# Patient Record
Sex: Male | Born: 1955 | Race: White | Hispanic: No | Marital: Single
Health system: Southern US, Community
[De-identification: ages and names within clinical notes are randomized; demographics above are authoritative.]

---

## 2006-11-29 ENCOUNTER — Ambulatory Visit: Payer: Self-pay | Admitting: Unknown Physician Specialty

## 2007-02-25 ENCOUNTER — Other Ambulatory Visit: Payer: Self-pay

## 2007-02-25 ENCOUNTER — Emergency Department: Payer: Self-pay | Admitting: Emergency Medicine

## 2008-02-09 ENCOUNTER — Ambulatory Visit: Payer: Self-pay | Admitting: General Surgery

## 2009-04-14 IMAGING — CR DG CHEST 2V
1 series · 3 of 3 positions shown · non-contrast
Comparison: none

REASON FOR EXAM: Chest pain
COMMENTS:

PROCEDURE:     DXR - DXR CHEST PA (OR AP) AND LATERAL  - February 25, 2007  [DATE]
RESULT:     The lungs are clear. The cardiac silhouette and visualized bony
skeleton are unremarkable.

[Series 1: view not recorded · 0.17mm/px · 3 of 3 slices shown]
[im 1/3]
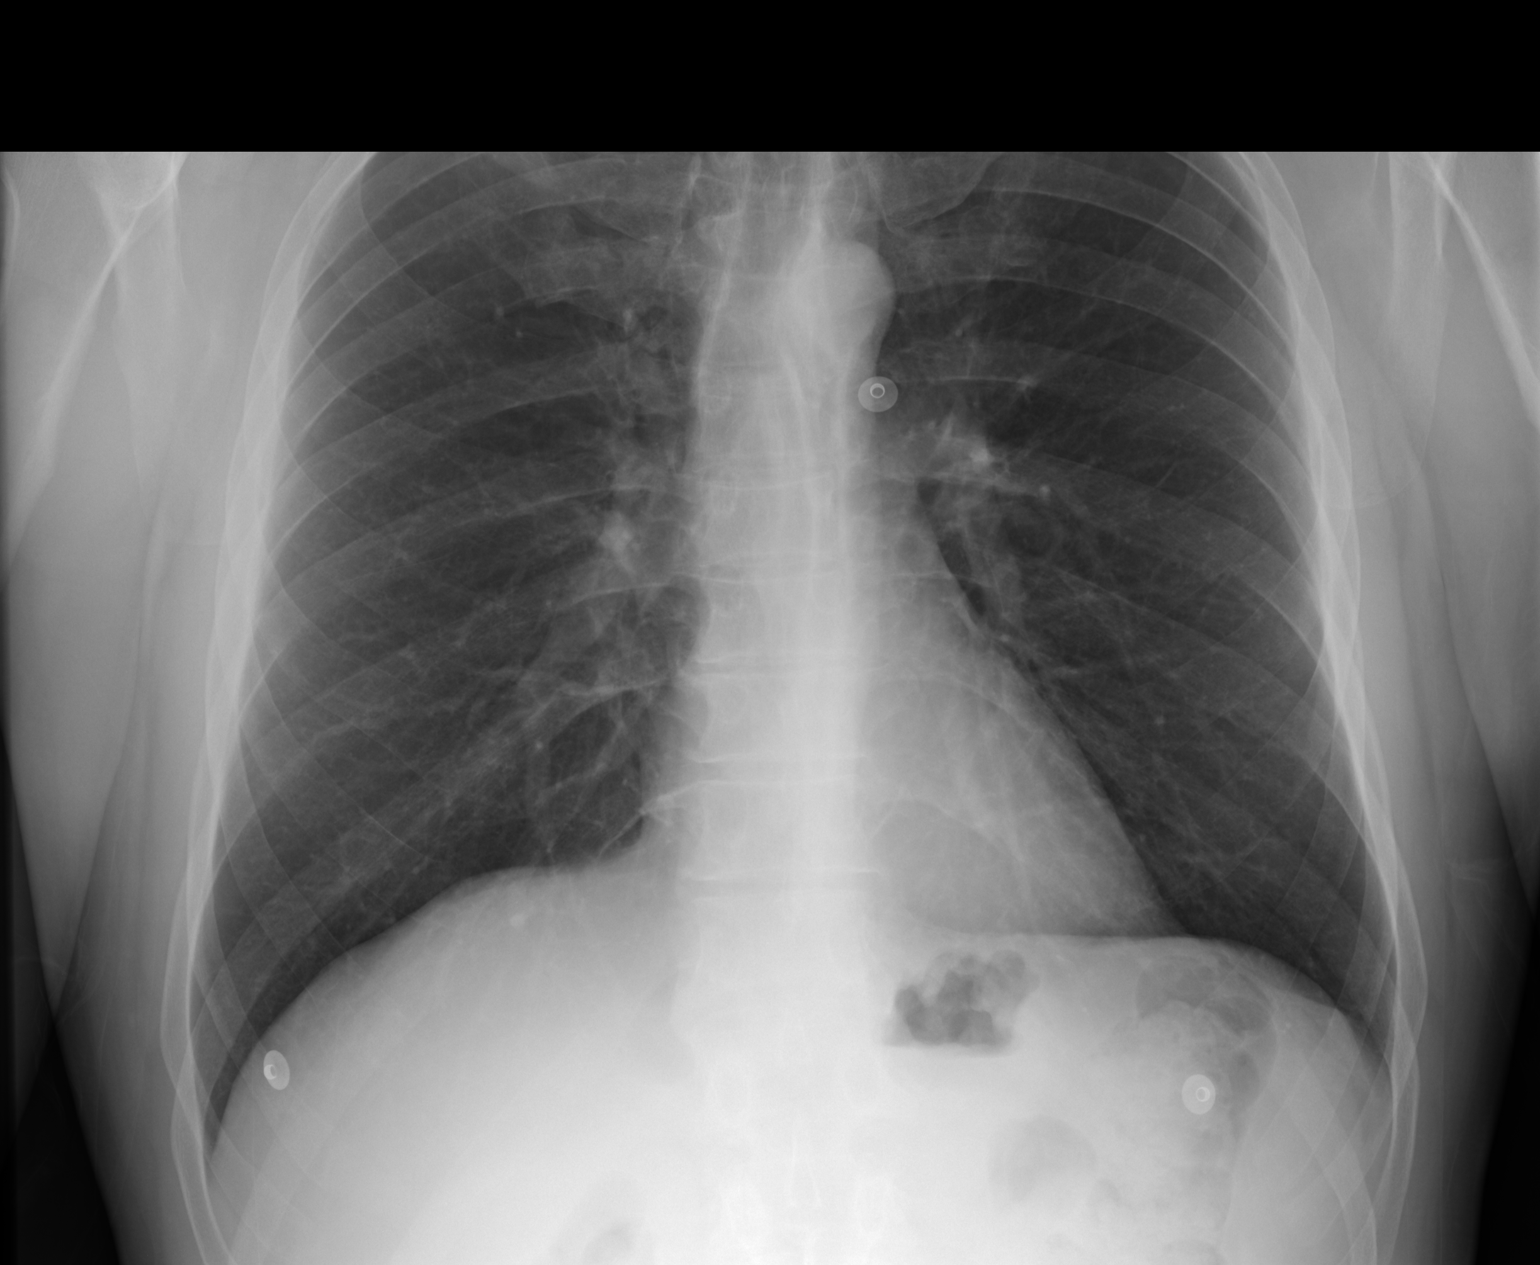
[im 2/3]
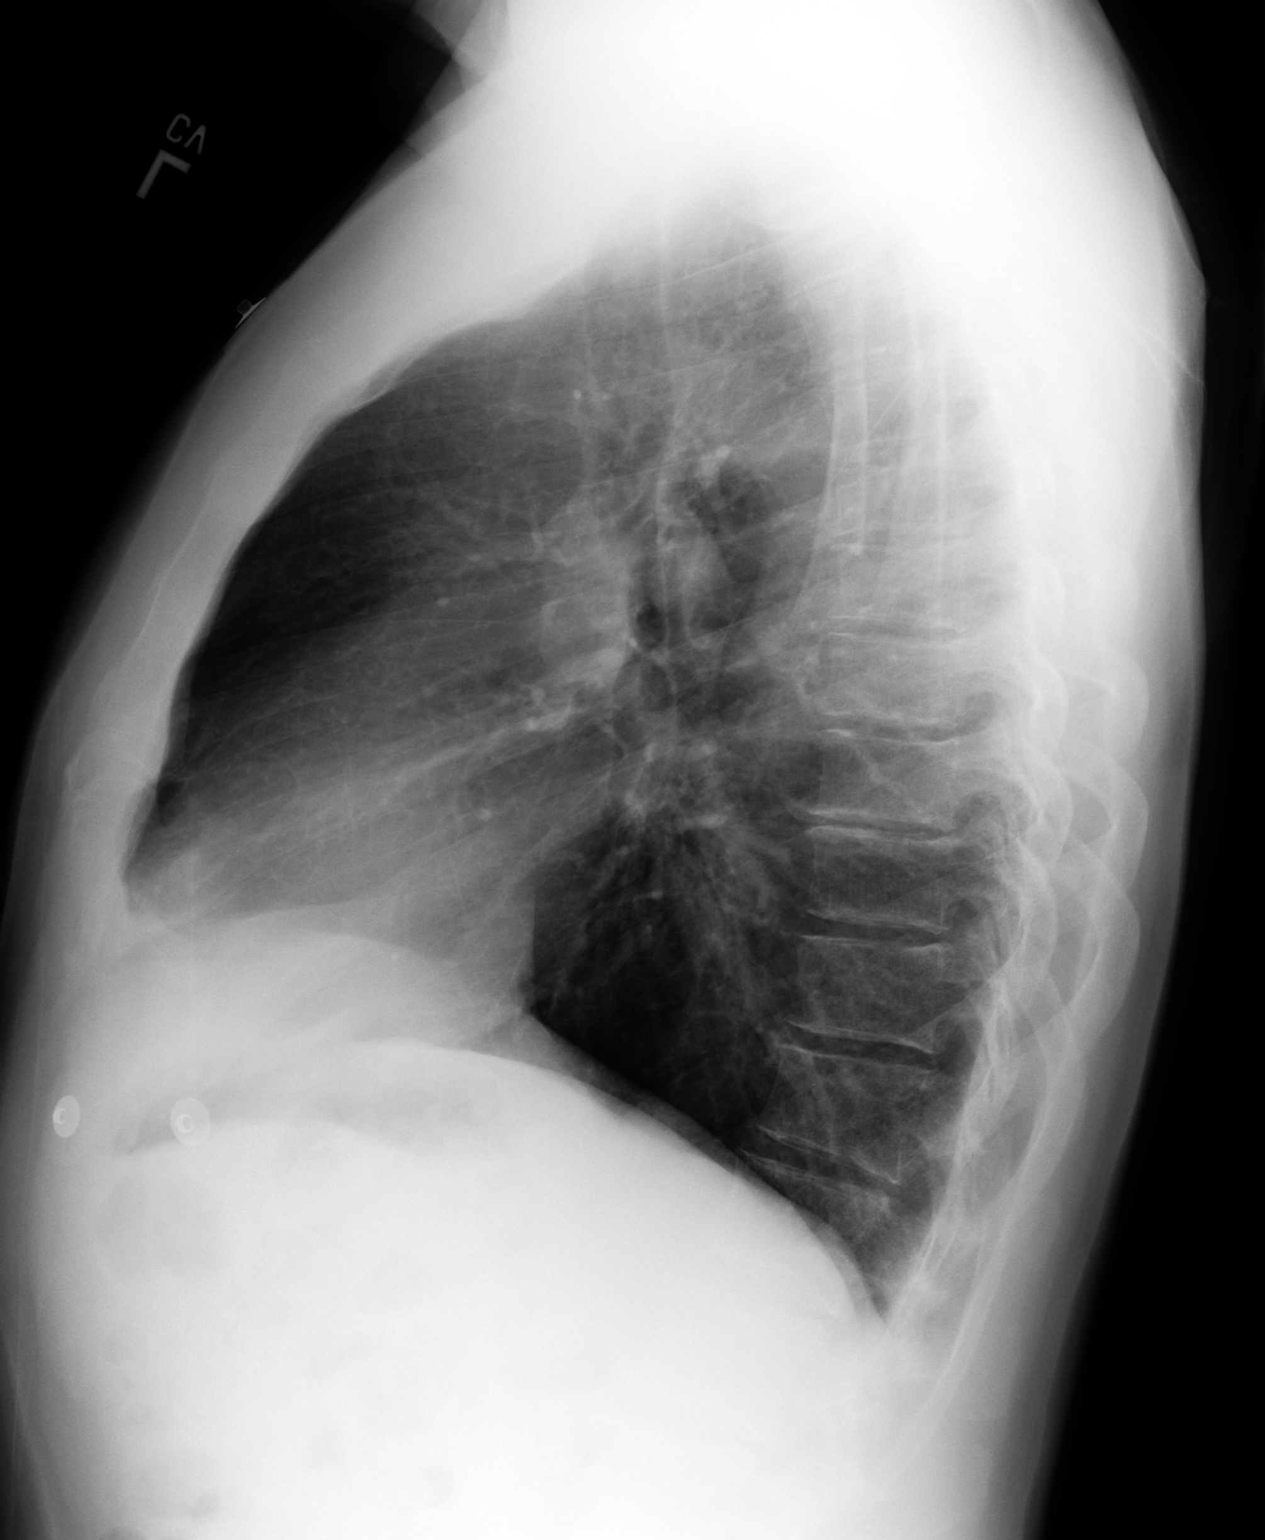
[im 3/3]
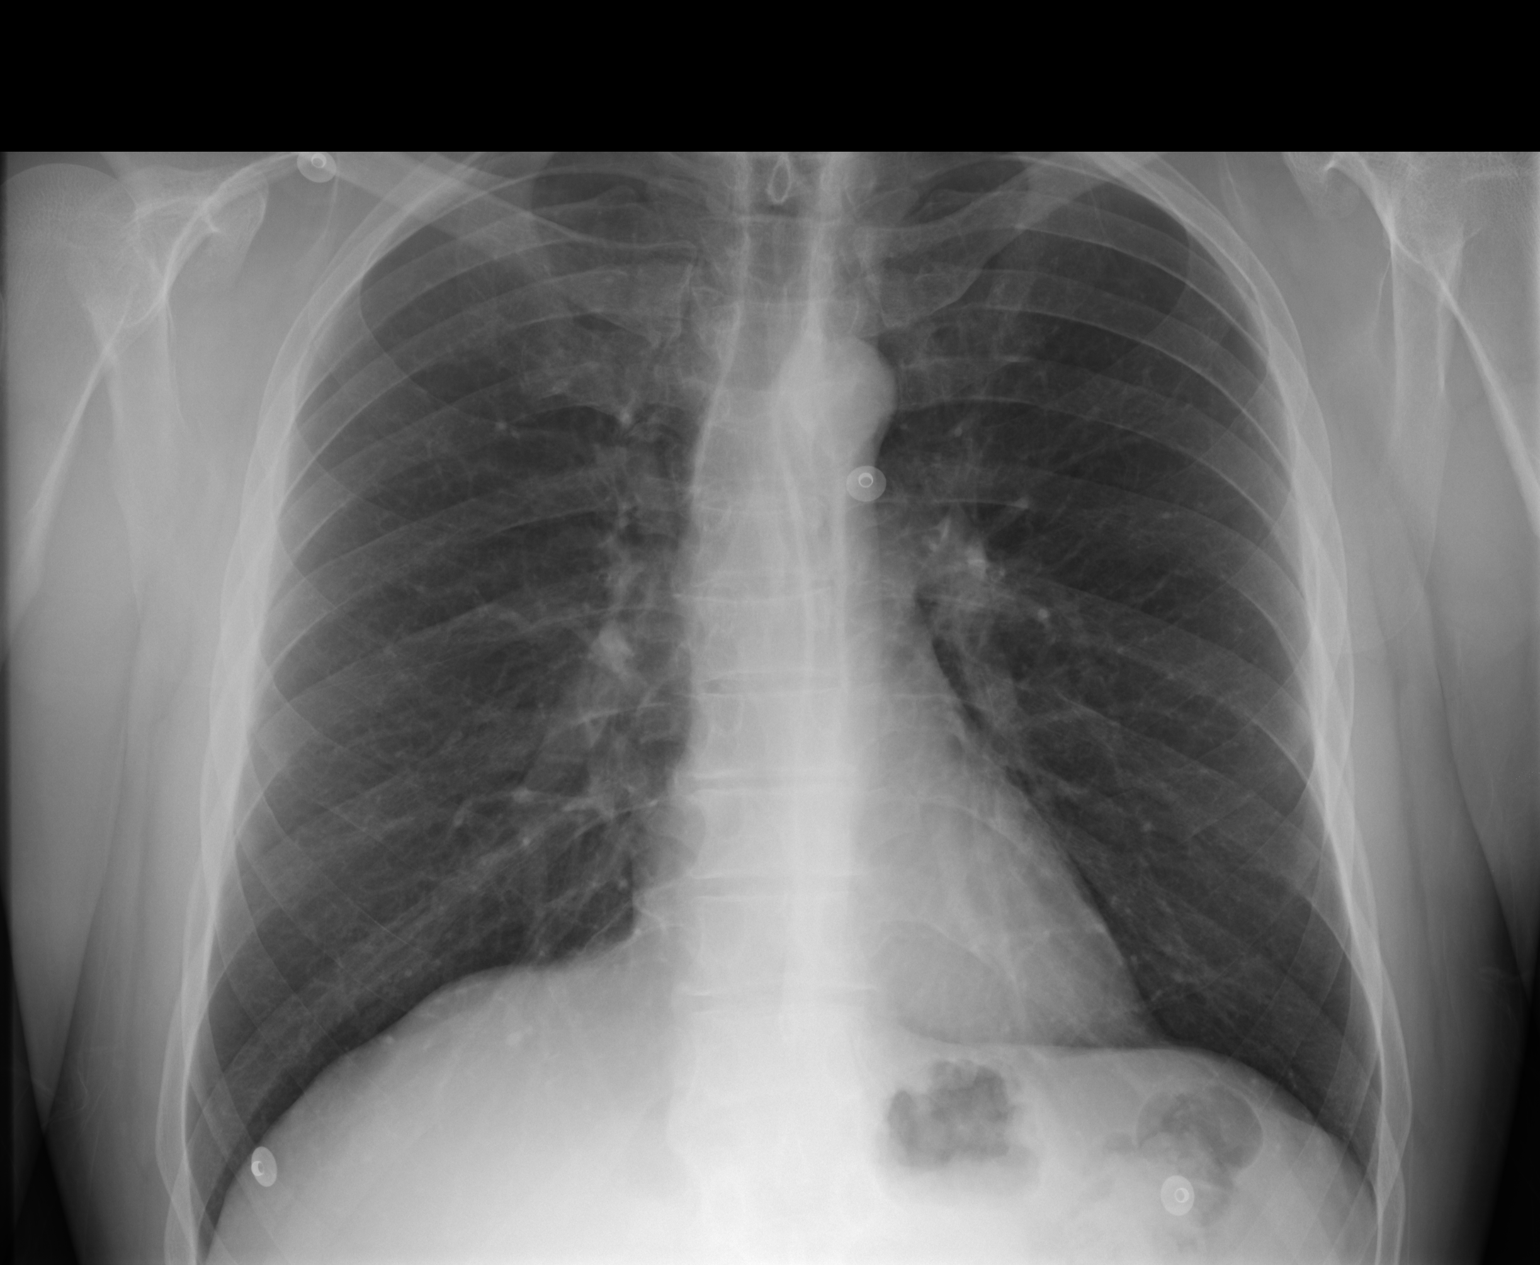

[3 of 3 positions shown; findings below may reference images not displayed]

IMPRESSION: Chest radiograph without evidence of acute cardiopulmonary
disease.

## 2010-03-29 IMAGING — CR DG CHEST 2V
1 series · 2 of 2 positions shown · non-contrast
Comparison: none

REASON FOR EXAM: pneumothorax
COMMENTS:

[Series 1: view not recorded · 0.17mm/px · 2 of 2 slices shown]
[im 1/2]
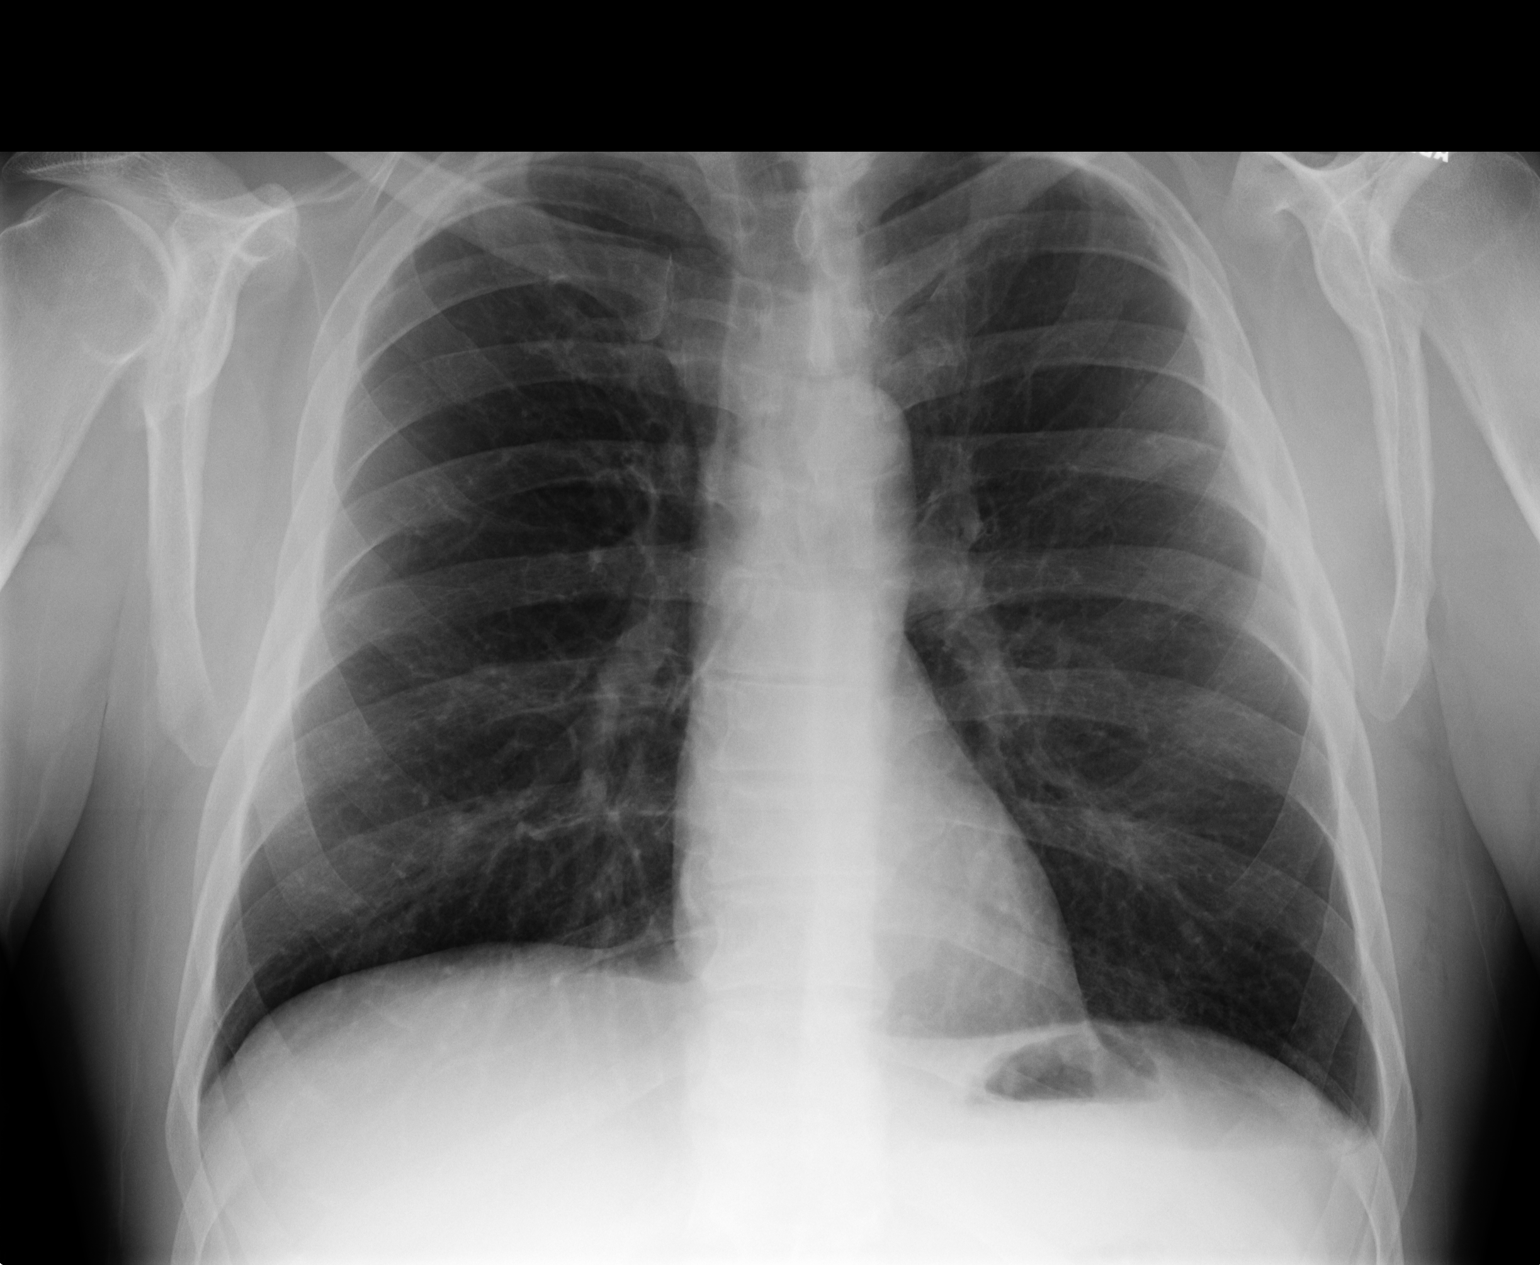
[im 2/2]
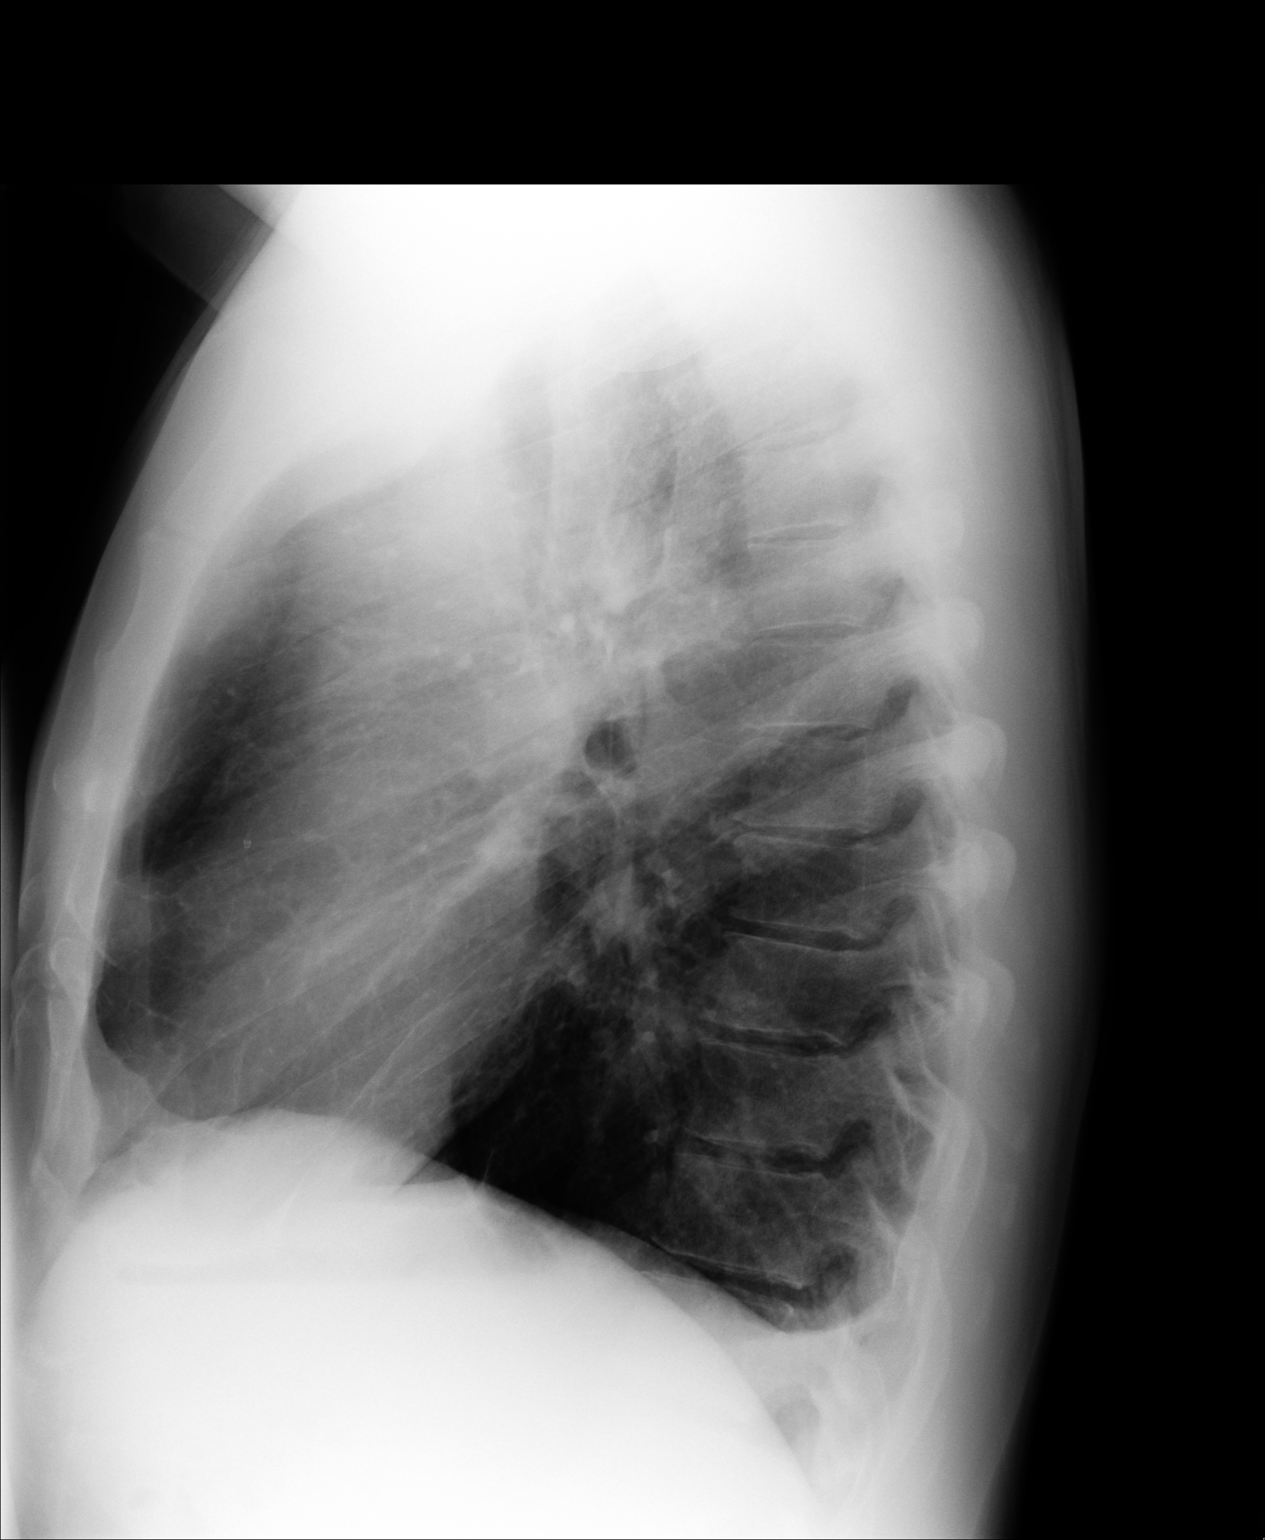

[2 of 2 positions shown; findings below may reference images not displayed]

PROCEDURE:     KDR - KDXR CHEST PA (OR AP) AND LAT  - February 09, 2008 [DATE]

RESULT:     Comparison is made to a prior exam of 02/25/2007. The lung
fields are clear of infiltrate. In the lateral view there is noted blunting
of the LEFT posterior gutter compatible with a trace LEFT effusion. No
definite rib fracture is identified on the current exam. There is a
questionable 5 mm pneumothorax at the LEFT apex. This, however, is not
definite. No other areas suspicious for pneumothorax are seen. The chest is
mildly hyperexpanded. Heart size is normal.
IMPRESSION: 1. Trace LEFT pleural effusion.
2. No definite pneumothorax is seen but as mentioned above there is a
questionable trace pneumothorax at the LEFT apex.
3. The chest is hyperexpanded.

## 2017-04-03 ENCOUNTER — Other Ambulatory Visit: Payer: Self-pay | Admitting: Internal Medicine

## 2017-04-03 DIAGNOSIS — J329 Chronic sinusitis, unspecified: Secondary | ICD-10-CM

## 2017-04-19 ENCOUNTER — Ambulatory Visit: Payer: 59

## 2019-07-17 ENCOUNTER — Other Ambulatory Visit: Payer: Self-pay | Admitting: Dermatology

## 2019-07-29 ENCOUNTER — Other Ambulatory Visit: Payer: Self-pay

## 2019-07-29 ENCOUNTER — Ambulatory Visit: Payer: 59 | Admitting: Dermatology

## 2019-07-29 ENCOUNTER — Encounter: Payer: Self-pay | Admitting: Dermatology

## 2019-07-29 DIAGNOSIS — L130 Dermatitis herpetiformis: Secondary | ICD-10-CM | POA: Diagnosis not present

## 2019-07-29 DIAGNOSIS — D692 Other nonthrombocytopenic purpura: Secondary | ICD-10-CM

## 2019-07-29 DIAGNOSIS — L820 Inflamed seborrheic keratosis: Secondary | ICD-10-CM | POA: Diagnosis not present

## 2019-07-29 DIAGNOSIS — L570 Actinic keratosis: Secondary | ICD-10-CM | POA: Diagnosis not present

## 2019-07-29 DIAGNOSIS — L578 Other skin changes due to chronic exposure to nonionizing radiation: Secondary | ICD-10-CM

## 2019-07-29 DIAGNOSIS — L821 Other seborrheic keratosis: Secondary | ICD-10-CM

## 2019-07-29 NOTE — Progress Notes (Signed)
   Follow-Up Visit   Subjective  Arthur Pacheco is a 64 y.o. male who presents for the following: spot (right forearm. Hx of changing color.). Patient has a long history of dermatitis herpetiformis, biopsy proven. He is well-controlled on a gluten free diet.   The following portions of the chart were reviewed this encounter and updated as appropriate: Allergies  Meds  Problems  Med Hx  Surg Hx  Fam Hx      Review of Systems: No other skin or systemic complaints.  Objective  Well appearing patient in no apparent distress; mood and affect are within normal limits.  A focused examination was performed including arms. Relevant physical exam findings are noted in the Assessment and Plan.  Objective  Right Mid to Superior Helix: Erythematous thin papules/macules with gritty scale.   Objective  arms: Clear today.  Objective  Right Mid Volar Forearm: Purpura with waxy tan macule.  Assessment & Plan    Actinic Damage - diffuse scaly erythematous macules with underlying dyspigmentation - Recommend daily broad spectrum sunscreen SPF 30+ to sun-exposed areas, reapply every 2 hours as needed.  - Call for new or changing lesions.  Seborrheic Keratoses - Stuck-on, waxy, tan-brown papules and plaques  - Discussed benign etiology and prognosis. - Observe - Call for any changes   Purpura - Violaceous macules and patches - Benign - Related to age, sun damage and/or use of blood thinners - Observe - Can use OTC arnica containing moisturizer such as Dermend Bruise Formula if desired - Call for worsening or other concerns   AK (actinic keratosis) Right Mid to Superior Helix  Destruction of lesion - Right Mid to Superior Helix Complexity: simple   Destruction method: cryotherapy   Informed consent: discussed and consent obtained   Timeout:  patient name, date of birth, surgical site, and procedure verified Lesion destroyed using liquid nitrogen: Yes   Region frozen  until ice ball extended beyond lesion: Yes   Outcome: patient tolerated procedure well with no complications   Post-procedure details: wound care instructions given    Dermatitis herpetiformis arms Clear today. Observe for recurrence. Call clinic for new or changing lesions.  Recommend regular skin exams, daily broad-spectrum spf 30+ sunscreen use, and photoprotection.    Biopsy proven. Patient is well-controlled on a gluten free diet.   Senile purpura (HCC) Right Mid Volar Forearm  With Inflamed SK.   Discussed LN2, but defers today since area is improving. Benign, observe.    Return in about 1 year (around 07/28/2020) for TBSE.   Wendee Beavers, CMA, am acting as scribe for Armida Sans, MD . Documentation: I have reviewed the above documentation for accuracy and completeness, and I agree with the above.  Armida Sans, MD

## 2020-08-03 ENCOUNTER — Other Ambulatory Visit: Payer: Self-pay

## 2020-08-03 ENCOUNTER — Ambulatory Visit: Payer: BC Managed Care – PPO | Admitting: Dermatology

## 2020-08-03 DIAGNOSIS — R21 Rash and other nonspecific skin eruption: Secondary | ICD-10-CM

## 2020-08-03 DIAGNOSIS — L130 Dermatitis herpetiformis: Secondary | ICD-10-CM

## 2020-08-03 DIAGNOSIS — L219 Seborrheic dermatitis, unspecified: Secondary | ICD-10-CM

## 2020-08-03 MED ORDER — HYDROCORTISONE 2.5 % EX CREA: TOPICAL_CREAM | CUTANEOUS | 12 refills | Status: AC

## 2020-08-03 MED ORDER — PIMECROLIMUS 1 % EX CREA: TOPICAL_CREAM | CUTANEOUS | 1 refills | Status: AC

## 2020-08-03 MED ORDER — KETOCONAZOLE 2 % EX CREA: TOPICAL_CREAM | CUTANEOUS | 6 refills | Status: AC

## 2020-08-03 NOTE — Progress Notes (Signed)
   Follow-Up Visit   Subjective  Arthur Pacheco is a 65 y.o. male who presents for the following: Rash (Around the eyes - with hx of allergy issues, patient would like to discuss treatment options. ). Patient declines TBSE today stating he has to many other issues going on right now. Patient would like refills of his seborrheic dermatitis medications - HC 2.5% cream and Ketoconazole 2% cream. Patient states well controlled on treatment. Hx of dermatitis herpetiformis - uses Dapsone 25mg  po QD PRN and has plenty of refills.   The following portions of the chart were reviewed this encounter and updated as appropriate:   Allergies  Meds  Problems  Med Hx  Surg Hx  Fam Hx     Review of Systems:  No other skin or systemic complaints except as noted in HPI or Assessment and Plan.  Objective  Well appearing patient in no apparent distress; mood and affect are within normal limits.  A focused examination was performed including the face . Relevant physical exam findings are noted in the Assessment and Plan.  Objective  B/L infra ocular: Erythema   Objective  Face: Pink patches with greasy scale.   Objective  Trunk, extremities: Clear.   Assessment & Plan  Rash - Eyelid Dermatitis B/L eyelids Likely related to pollen/allergies -  Start Elidel cream to aa's BID x 2 weeks.  If not covered will send in Protopic ointment.   pimecrolimus (ELIDEL) 1 % cream - B/L infra ocular  Seborrheic dermatitis Face Seborrheic Dermatitis  -  is a chronic persistent rash characterized by pinkness and scaling most commonly of the mid face but also can occur on the scalp (dandruff), ears; mid chest and mid back. It tends to be exacerbated by stress and cooler weather.  People who have neurologic disease may experience new onset or exacerbation of existing seborrheic dermatitis.  The condition is not curable but treatable and can be controlled.  Continue alternating Ketoconazole 2% cream QOD with  HC 2.5% cream QOD.  ketoconazole (NIZORAL) 2 % cream - Face  Dermatitis herpetiformis Trunk, extremities Bx proven -  Continue gluten free diet and Dapsone 25mg  po QD PRN flares.  The patient rarely takes dapsone and has only taken it once in the past few months.  He is does not need refills.  He has a few pills left. Refills on any dapsone we will need to get blood works with CBC and differential.  He understands Return in about 1 year (around 08/03/2021) for TBSE.  , CMA, am acting as scribe for 08/05/2021, MD .  Documentation: I have reviewed the above documentation for accuracy and completeness, and I agree with the above.  Maylene Roes, MD

## 2020-08-03 NOTE — Patient Instructions (Signed)

## 2020-08-05 ENCOUNTER — Encounter: Payer: Self-pay | Admitting: Dermatology

## 2020-08-15 ENCOUNTER — Other Ambulatory Visit: Payer: Self-pay

## 2020-08-15 MED ORDER — TACROLIMUS 0.1 % EX OINT: TOPICAL_OINTMENT | Freq: Two times a day (BID) | CUTANEOUS | 0 refills | Status: DC

## 2020-08-15 NOTE — Progress Notes (Signed)
Elidel not covered. Tacrolimus sent in per office note.

## 2020-08-19 ENCOUNTER — Other Ambulatory Visit: Payer: Self-pay

## 2020-08-19 ENCOUNTER — Telehealth: Payer: Self-pay

## 2020-08-19 NOTE — Telephone Encounter (Signed)
Elidel and Protopic not covered by insurance. What medication would you like for me to send in for eyelid dermatitis (likely related to pollen/allergies)?

## 2020-08-19 NOTE — Progress Notes (Signed)
Created in error

## 2020-08-19 NOTE — Telephone Encounter (Signed)
Try sending Desonide lotion.  Disp small trade No rf

## 2020-08-22 ENCOUNTER — Other Ambulatory Visit: Payer: Self-pay

## 2020-08-22 DIAGNOSIS — H01131 Eczematous dermatitis of right upper eyelid: Secondary | ICD-10-CM

## 2020-08-22 DIAGNOSIS — H01135 Eczematous dermatitis of left lower eyelid: Secondary | ICD-10-CM

## 2020-08-22 MED ORDER — TACROLIMUS 0.1 % EX OINT: TOPICAL_OINTMENT | Freq: Two times a day (BID) | CUTANEOUS | 0 refills | Status: AC

## 2020-08-22 MED ORDER — DESONIDE 0.05 % EX LOTN: TOPICAL_LOTION | Freq: Two times a day (BID) | CUTANEOUS | 0 refills | Status: AC

## 2020-08-22 NOTE — Telephone Encounter (Signed)
Desonide lotion sent to pharmacy.

## 2020-08-22 NOTE — Progress Notes (Signed)
Change of qty for patient to use [employer] coupon for Tacrolimus.

## 2021-08-09 ENCOUNTER — Encounter: Payer: BC Managed Care – PPO | Admitting: Dermatology

## 2021-09-27 DIAGNOSIS — M1711 Unilateral primary osteoarthritis, right knee: Secondary | ICD-10-CM | POA: Diagnosis not present

## 2021-09-27 DIAGNOSIS — K219 Gastro-esophageal reflux disease without esophagitis: Secondary | ICD-10-CM | POA: Diagnosis not present

## 2021-09-27 DIAGNOSIS — H6692 Otitis media, unspecified, left ear: Secondary | ICD-10-CM | POA: Diagnosis not present

## 2022-01-09 DIAGNOSIS — K219 Gastro-esophageal reflux disease without esophagitis: Secondary | ICD-10-CM | POA: Diagnosis not present

## 2022-01-09 DIAGNOSIS — J0191 Acute recurrent sinusitis, unspecified: Secondary | ICD-10-CM | POA: Diagnosis not present

## 2022-01-22 ENCOUNTER — Other Ambulatory Visit: Payer: Self-pay | Admitting: Dermatology

## 2022-01-22 DIAGNOSIS — L219 Seborrheic dermatitis, unspecified: Secondary | ICD-10-CM

## 2022-01-31 DIAGNOSIS — Z0000 Encounter for general adult medical examination without abnormal findings: Secondary | ICD-10-CM | POA: Diagnosis not present

## 2022-01-31 DIAGNOSIS — R7309 Other abnormal glucose: Secondary | ICD-10-CM | POA: Diagnosis not present

## 2022-01-31 DIAGNOSIS — M170 Bilateral primary osteoarthritis of knee: Secondary | ICD-10-CM | POA: Diagnosis not present

## 2022-01-31 DIAGNOSIS — R7989 Other specified abnormal findings of blood chemistry: Secondary | ICD-10-CM | POA: Diagnosis not present

## 2022-01-31 DIAGNOSIS — K9041 Non-celiac gluten sensitivity: Secondary | ICD-10-CM | POA: Diagnosis not present

## 2022-01-31 DIAGNOSIS — J302 Other seasonal allergic rhinitis: Secondary | ICD-10-CM | POA: Diagnosis not present

## 2022-02-07 DIAGNOSIS — K9041 Non-celiac gluten sensitivity: Secondary | ICD-10-CM | POA: Diagnosis not present

## 2022-02-07 DIAGNOSIS — M25562 Pain in left knee: Secondary | ICD-10-CM | POA: Diagnosis not present

## 2022-02-07 DIAGNOSIS — M1711 Unilateral primary osteoarthritis, right knee: Secondary | ICD-10-CM | POA: Diagnosis not present

## 2022-02-07 DIAGNOSIS — M25461 Effusion, right knee: Secondary | ICD-10-CM | POA: Diagnosis not present

## 2022-02-07 DIAGNOSIS — Z23 Encounter for immunization: Secondary | ICD-10-CM | POA: Diagnosis not present

## 2022-02-07 DIAGNOSIS — M25462 Effusion, left knee: Secondary | ICD-10-CM | POA: Diagnosis not present

## 2022-02-13 DIAGNOSIS — M170 Bilateral primary osteoarthritis of knee: Secondary | ICD-10-CM | POA: Diagnosis not present

## 2022-02-13 DIAGNOSIS — M25562 Pain in left knee: Secondary | ICD-10-CM | POA: Diagnosis not present

## 2022-02-13 DIAGNOSIS — M25561 Pain in right knee: Secondary | ICD-10-CM | POA: Diagnosis not present

## 2022-02-21 DIAGNOSIS — M174 Other bilateral secondary osteoarthritis of knee: Secondary | ICD-10-CM | POA: Diagnosis not present

## 2022-02-26 DIAGNOSIS — Z0100 Encounter for examination of eyes and vision without abnormal findings: Secondary | ICD-10-CM | POA: Diagnosis not present

## 2022-02-26 DIAGNOSIS — H2513 Age-related nuclear cataract, bilateral: Secondary | ICD-10-CM | POA: Diagnosis not present

## 2022-02-26 DIAGNOSIS — H33031 Retinal detachment with giant retinal tear, right eye: Secondary | ICD-10-CM | POA: Diagnosis not present

## 2022-02-26 DIAGNOSIS — H04123 Dry eye syndrome of bilateral lacrimal glands: Secondary | ICD-10-CM | POA: Diagnosis not present

## 2022-02-28 DIAGNOSIS — M174 Other bilateral secondary osteoarthritis of knee: Secondary | ICD-10-CM | POA: Diagnosis not present

## 2022-03-07 DIAGNOSIS — M174 Other bilateral secondary osteoarthritis of knee: Secondary | ICD-10-CM | POA: Diagnosis not present

## 2022-03-12 ENCOUNTER — Other Ambulatory Visit: Payer: Self-pay | Admitting: Dermatology

## 2022-03-14 DIAGNOSIS — M170 Bilateral primary osteoarthritis of knee: Secondary | ICD-10-CM | POA: Diagnosis not present

## 2022-06-06 DIAGNOSIS — Z125 Encounter for screening for malignant neoplasm of prostate: Secondary | ICD-10-CM | POA: Diagnosis not present

## 2022-06-06 DIAGNOSIS — M170 Bilateral primary osteoarthritis of knee: Secondary | ICD-10-CM | POA: Diagnosis not present

## 2022-06-06 DIAGNOSIS — R7309 Other abnormal glucose: Secondary | ICD-10-CM | POA: Diagnosis not present

## 2022-06-06 DIAGNOSIS — R7989 Other specified abnormal findings of blood chemistry: Secondary | ICD-10-CM | POA: Diagnosis not present

## 2022-06-06 DIAGNOSIS — Z23 Encounter for immunization: Secondary | ICD-10-CM | POA: Diagnosis not present

## 2022-06-13 DIAGNOSIS — R7309 Other abnormal glucose: Secondary | ICD-10-CM | POA: Diagnosis not present

## 2022-06-13 DIAGNOSIS — K9041 Non-celiac gluten sensitivity: Secondary | ICD-10-CM | POA: Diagnosis not present

## 2022-06-13 DIAGNOSIS — K219 Gastro-esophageal reflux disease without esophagitis: Secondary | ICD-10-CM | POA: Diagnosis not present

## 2022-06-13 DIAGNOSIS — Z0000 Encounter for general adult medical examination without abnormal findings: Secondary | ICD-10-CM | POA: Diagnosis not present

## 2022-06-13 DIAGNOSIS — R7989 Other specified abnormal findings of blood chemistry: Secondary | ICD-10-CM | POA: Diagnosis not present

## 2022-06-13 DIAGNOSIS — M170 Bilateral primary osteoarthritis of knee: Secondary | ICD-10-CM | POA: Diagnosis not present

## 2022-06-28 DIAGNOSIS — J40 Bronchitis, not specified as acute or chronic: Secondary | ICD-10-CM | POA: Diagnosis not present

## 2022-06-28 DIAGNOSIS — R059 Cough, unspecified: Secondary | ICD-10-CM | POA: Diagnosis not present

## 2022-06-28 DIAGNOSIS — R0602 Shortness of breath: Secondary | ICD-10-CM | POA: Diagnosis not present

## 2022-06-28 DIAGNOSIS — R7309 Other abnormal glucose: Secondary | ICD-10-CM | POA: Diagnosis not present

## 2022-08-20 DIAGNOSIS — M170 Bilateral primary osteoarthritis of knee: Secondary | ICD-10-CM | POA: Diagnosis not present

## 2022-08-20 DIAGNOSIS — F418 Other specified anxiety disorders: Secondary | ICD-10-CM | POA: Diagnosis not present

## 2022-08-20 DIAGNOSIS — K219 Gastro-esophageal reflux disease without esophagitis: Secondary | ICD-10-CM | POA: Diagnosis not present

## 2022-12-03 DIAGNOSIS — M170 Bilateral primary osteoarthritis of knee: Secondary | ICD-10-CM | POA: Diagnosis not present

## 2022-12-03 DIAGNOSIS — J302 Other seasonal allergic rhinitis: Secondary | ICD-10-CM | POA: Diagnosis not present

## 2022-12-03 DIAGNOSIS — R7989 Other specified abnormal findings of blood chemistry: Secondary | ICD-10-CM | POA: Diagnosis not present

## 2022-12-03 DIAGNOSIS — F418 Other specified anxiety disorders: Secondary | ICD-10-CM | POA: Diagnosis not present

## 2022-12-03 DIAGNOSIS — K9041 Non-celiac gluten sensitivity: Secondary | ICD-10-CM | POA: Diagnosis not present

## 2022-12-03 DIAGNOSIS — K219 Gastro-esophageal reflux disease without esophagitis: Secondary | ICD-10-CM | POA: Diagnosis not present

## 2022-12-03 DIAGNOSIS — R7309 Other abnormal glucose: Secondary | ICD-10-CM | POA: Diagnosis not present

## 2022-12-10 DIAGNOSIS — R7989 Other specified abnormal findings of blood chemistry: Secondary | ICD-10-CM | POA: Diagnosis not present

## 2022-12-10 DIAGNOSIS — R7309 Other abnormal glucose: Secondary | ICD-10-CM | POA: Diagnosis not present

## 2022-12-10 DIAGNOSIS — N401 Enlarged prostate with lower urinary tract symptoms: Secondary | ICD-10-CM | POA: Diagnosis not present

## 2022-12-10 DIAGNOSIS — Z0000 Encounter for general adult medical examination without abnormal findings: Secondary | ICD-10-CM | POA: Diagnosis not present

## 2022-12-10 DIAGNOSIS — Z1331 Encounter for screening for depression: Secondary | ICD-10-CM | POA: Diagnosis not present

## 2022-12-10 DIAGNOSIS — K9041 Non-celiac gluten sensitivity: Secondary | ICD-10-CM | POA: Diagnosis not present

## 2022-12-10 DIAGNOSIS — M170 Bilateral primary osteoarthritis of knee: Secondary | ICD-10-CM | POA: Diagnosis not present

## 2022-12-10 DIAGNOSIS — F324 Major depressive disorder, single episode, in partial remission: Secondary | ICD-10-CM | POA: Diagnosis not present

## 2022-12-10 DIAGNOSIS — M778 Other enthesopathies, not elsewhere classified: Secondary | ICD-10-CM | POA: Diagnosis not present

## 2023-02-06 DIAGNOSIS — H2513 Age-related nuclear cataract, bilateral: Secondary | ICD-10-CM | POA: Diagnosis not present

## 2023-02-06 DIAGNOSIS — Z8669 Personal history of other diseases of the nervous system and sense organs: Secondary | ICD-10-CM | POA: Diagnosis not present

## 2023-02-06 DIAGNOSIS — H04123 Dry eye syndrome of bilateral lacrimal glands: Secondary | ICD-10-CM | POA: Diagnosis not present

## 2023-06-13 DIAGNOSIS — M170 Bilateral primary osteoarthritis of knee: Secondary | ICD-10-CM | POA: Diagnosis not present

## 2023-06-13 DIAGNOSIS — R7309 Other abnormal glucose: Secondary | ICD-10-CM | POA: Diagnosis not present

## 2023-06-13 DIAGNOSIS — K9041 Non-celiac gluten sensitivity: Secondary | ICD-10-CM | POA: Diagnosis not present

## 2023-06-13 DIAGNOSIS — Z0000 Encounter for general adult medical examination without abnormal findings: Secondary | ICD-10-CM | POA: Diagnosis not present

## 2023-06-13 DIAGNOSIS — F324 Major depressive disorder, single episode, in partial remission: Secondary | ICD-10-CM | POA: Diagnosis not present

## 2023-08-23 DIAGNOSIS — M1990 Unspecified osteoarthritis, unspecified site: Secondary | ICD-10-CM | POA: Diagnosis not present

## 2023-08-23 DIAGNOSIS — K449 Diaphragmatic hernia without obstruction or gangrene: Secondary | ICD-10-CM | POA: Diagnosis not present

## 2023-08-23 DIAGNOSIS — R1013 Epigastric pain: Secondary | ICD-10-CM | POA: Diagnosis not present

## 2023-08-23 DIAGNOSIS — R1010 Upper abdominal pain, unspecified: Secondary | ICD-10-CM | POA: Diagnosis not present

## 2023-08-23 DIAGNOSIS — K219 Gastro-esophageal reflux disease without esophagitis: Secondary | ICD-10-CM | POA: Diagnosis not present

## 2023-09-13 DIAGNOSIS — Z03818 Encounter for observation for suspected exposure to other biological agents ruled out: Secondary | ICD-10-CM | POA: Diagnosis not present

## 2023-09-13 DIAGNOSIS — M25511 Pain in right shoulder: Secondary | ICD-10-CM | POA: Diagnosis not present

## 2023-09-13 DIAGNOSIS — M25512 Pain in left shoulder: Secondary | ICD-10-CM | POA: Diagnosis not present

## 2023-09-13 DIAGNOSIS — R059 Cough, unspecified: Secondary | ICD-10-CM | POA: Diagnosis not present

## 2023-09-13 DIAGNOSIS — K9041 Non-celiac gluten sensitivity: Secondary | ICD-10-CM | POA: Diagnosis not present

## 2023-09-13 DIAGNOSIS — M170 Bilateral primary osteoarthritis of knee: Secondary | ICD-10-CM | POA: Diagnosis not present

## 2023-09-13 DIAGNOSIS — K219 Gastro-esophageal reflux disease without esophagitis: Secondary | ICD-10-CM | POA: Diagnosis not present

## 2023-12-04 DIAGNOSIS — H2513 Age-related nuclear cataract, bilateral: Secondary | ICD-10-CM | POA: Diagnosis not present

## 2023-12-04 DIAGNOSIS — H04123 Dry eye syndrome of bilateral lacrimal glands: Secondary | ICD-10-CM | POA: Diagnosis not present

## 2023-12-19 DIAGNOSIS — R7309 Other abnormal glucose: Secondary | ICD-10-CM | POA: Diagnosis not present

## 2023-12-19 DIAGNOSIS — F324 Major depressive disorder, single episode, in partial remission: Secondary | ICD-10-CM | POA: Diagnosis not present

## 2023-12-19 DIAGNOSIS — Z1331 Encounter for screening for depression: Secondary | ICD-10-CM | POA: Diagnosis not present

## 2023-12-19 DIAGNOSIS — K219 Gastro-esophageal reflux disease without esophagitis: Secondary | ICD-10-CM | POA: Diagnosis not present

## 2023-12-19 DIAGNOSIS — K9041 Non-celiac gluten sensitivity: Secondary | ICD-10-CM | POA: Diagnosis not present

## 2023-12-19 DIAGNOSIS — M170 Bilateral primary osteoarthritis of knee: Secondary | ICD-10-CM | POA: Diagnosis not present
